# Patient Record
Sex: Male | Born: 2000 | Race: White | Hispanic: No | Marital: Single | Smoking: Current every day smoker
Health system: Southern US, Community
[De-identification: ages and names within clinical notes are randomized; demographics above are authoritative.]

## PROBLEM LIST (undated history)

## (undated) DIAGNOSIS — F909 Attention-deficit hyperactivity disorder, unspecified type: Secondary | ICD-10-CM

## (undated) DIAGNOSIS — I4891 Unspecified atrial fibrillation: Secondary | ICD-10-CM

---
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
## 2021-03-28 ENCOUNTER — Emergency Department (HOSPITAL_COMMUNITY)
Admission: EM | Admit: 2021-03-28 | Discharge: 2021-03-28 | Disposition: A | Discharge status: Home / Self Care | Payer: Medicaid Other | Source: Home / Self Care

## 2021-03-29 ENCOUNTER — Telehealth (HOSPITAL_COMMUNITY): Payer: Self-pay | Admitting: *Deleted

## 2021-03-29 NOTE — Telephone Encounter (Signed)
Opened in Error.

## 2021-04-05 ENCOUNTER — Encounter (HOSPITAL_COMMUNITY): Payer: Self-pay

## 2021-04-05 ENCOUNTER — Other Ambulatory Visit: Payer: Self-pay

## 2021-04-05 ENCOUNTER — Ambulatory Visit (INDEPENDENT_AMBULATORY_CARE_PROVIDER_SITE_OTHER): Payer: Medicaid Other | Admitting: Clinical

## 2021-04-05 DIAGNOSIS — F411 Generalized anxiety disorder: Secondary | ICD-10-CM
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
## 2021-04-05 NOTE — Plan of Care (Signed)
Verbal Consent 

## 2021-04-05 NOTE — Plan of Care (Signed)
Verbal consent  

## 2021-04-27 ENCOUNTER — Ambulatory Visit (INDEPENDENT_AMBULATORY_CARE_PROVIDER_SITE_OTHER): Payer: Medicaid Other | Admitting: Clinical

## 2021-04-27 ENCOUNTER — Other Ambulatory Visit: Payer: Self-pay

## 2021-04-27 DIAGNOSIS — F411 Generalized anxiety disorder: Secondary | ICD-10-CM

## 2021-04-27 NOTE — Progress Notes (Signed)
Virtual Visit via Telephone Note  I connected with Kenneth Walls on 04/27/21 at  4:00 PM EST by telephone and verified that I am speaking with the correct person using two identifiers.  Location: Patient: Home Provider: Office   I discussed the limitations, risks, security and privacy concerns of performing an evaluation and management service by telephone and the availability of in person appointments. I also discussed with the patient that there may be a patient responsible charge related to this service. The patient expressed understanding and agreed to proceed.   THERAPIST PROGRESS NOTE   Session Time: 4:00 PM- 4:30 PM   Participation Level: Active   Behavioral Response: CasualAlertAnxious   Type of Therapy: Individual Therapy   Treatment Goals addressed: Coping   Interventions: CBT, Motivational Interviewing, Strength-based and Supportive   Summary: Kenneth Walls is a 21 y.o. male who presents with GAD.  The OPT therapist worked with the patient for his ongoing OPT treatment. The OPT therapist utilized Motivational Interviewing to assist in creating therapeutic repore. The patient in the session was engaged and work in collaboration giving feedback about his triggers and symptoms over the past few weeks. The patient spoke about his work to manage his stressors. The patient spoke about his difficulty with over-thinking about things and reluctance to socialize due to wanting to avoid conflict. The patient spoke about his interaction being spending time with his brother who is 54yr younger than the patients.The OPT therapist utilized Cognitive Behavioral Therapy through cognitive restructuring as well as worked with the patient on coping strategies to assist in management of his diagnoses. The OPT therapist placed emphasis on the patient taking care of his basic needs. The OPT therapist reviewed with the patient his upcoming health appointments.     Suicidal/Homicidal:  Nowithout intent/plan   Therapist Response: The OPT therapist worked with the patient for the patients scheduled session. The patient was engaged in his session and gave feedback in relation to triggers, symptoms, and behavior responses over the past few weeks. The OPT therapist worked with the patient utilizing an in session Cognitive Behavioral Therapy exercise. 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 The OPT therapist placed emphasis with the patient on using coping skills and continuing to be compliant with all health recommendations from his specialist.The OPT .The patient overviewed his general health and attention to his basic needs in the areas of sleep, eating habits, physical exercise, and hygiene.The patient spoke about his desire to get his license have more freedom and move out to his own place.. The patient has been watching motivational videos from Youtube on how to make it through your day and how to meet your goals in life and noted these videos have been helpful. The OPT therapist will continue treatment work with the patient in his next scheduled session.    Plan: Return again in 3 weeks.   Diagnosis:      Axis I:  Generalized Anxiety Disorder                           Axis II: No diagnosis   I discussed the assessment and treatment plan with the patient. The patient was provided an opportunity to ask questions and  all were answered. The patient agreed with the plan and demonstrated an understanding of the instructions.   The patient was advised to call back or seek an in-person evaluation if the symptoms worsen or if the condition fails to improve as anticipated.   I provided 30 minutes of non-face-to-face time during this encounter.   Winfred Burn, LCSW    04/27/2021

## 2021-05-23 ENCOUNTER — Ambulatory Visit (INDEPENDENT_AMBULATORY_CARE_PROVIDER_SITE_OTHER): Payer: Medicaid Other | Admitting: Clinical

## 2021-05-23 ENCOUNTER — Other Ambulatory Visit: Payer: Self-pay

## 2021-05-23 DIAGNOSIS — F411 Generalized anxiety disorder: Secondary | ICD-10-CM | POA: Diagnosis not present

## 2021-05-23 NOTE — Progress Notes (Signed)
Virtual Visit via Telephone Note   I connected with Kenneth Walls on 05/23/21 at  3:00 PM EST by telephone and verified that I am speaking with the correct person using two identifiers.   Location: Patient: Home Provider: Office   I discussed the limitations, risks, security and privacy concerns of performing an evaluation and management service by telephone and the availability of in person appointments. I also discussed with the patient that there may be a patient responsible charge related to this service. The patient expressed understanding and agreed to proceed.     THERAPIST PROGRESS NOTE   Session Time: 3:00 PM- 3:30 PM   Participation Level: Active   Behavioral Response: CasualAlertAnxious   Type of Therapy: Individual Therapy   Treatment Goals addressed: Coping   Interventions: CBT, Motivational Interviewing, Strength-based and Supportive   Summary: Chukwuemeka Artola is a 21 y.o. male who presents with GAD.  The OPT therapist worked with the patient for his ongoing OPT treatment. The OPT therapist utilized Motivational Interviewing to assist in creating therapeutic repore. The patient in the session was engaged and work in collaboration giving feedback about his triggers and symptoms over the past few weeks. The patient spoke about his work to manage his stressors. The patient spoke about recently buying a four wheeler and this helping him to be able to get out of the house some and he has been riding a four wheeler with friends that go riding.The OPT therapist utilized Cognitive Behavioral Therapy through cognitive restructuring as well as worked with the patient on coping strategies to assist in management of his diagnoses. The OPT therapist placed emphasis on the patient taking care of his basic needs. The OPT therapist reviewed with the patient his upcoming health appointments.     Suicidal/Homicidal: Nowithout intent/plan   Therapist Response: The OPT therapist  worked with the patient for the patients scheduled session. The patient was engaged in his session and gave feedback in relation to triggers, symptoms, and behavior responses over the past few weeks. The OPT therapist worked with the patient utilizing an in session Cognitive Behavioral Therapy exercise. The patient was responsive in the session and verbalized, " I have doing better since I bought the four wheeler I have been able to get out and meet with some new people".The OPT .The patient overviewed his general health and attention to his basic needs in the areas of sleep, eating habits, physical exercise, and hygiene.The patient spoke about his ongoing hope to get his license have more freedom and move out to his own place. The OPT therapist will continue treatment work with the patient in his next scheduled session.    Plan: Return again in 3 weeks.   Diagnosis:      Axis I:  Generalized Anxiety Disorder                           Axis II: No diagnosis   I discussed the assessment and treatment plan with the patient. The patient was provided an opportunity to ask questions and all were answered. The patient agreed with the plan and demonstrated an understanding of the instructions.   The patient was advised to call back or seek an in-person evaluation if the symptoms worsen or if the condition fails to improve as anticipated.   I provided 30 minutes of non-face-to-face time during this encounter.   Winfred Burn, LCSW    05/23/2021

## 2021-06-14 ENCOUNTER — Ambulatory Visit (INDEPENDENT_AMBULATORY_CARE_PROVIDER_SITE_OTHER): Payer: Medicaid Other | Admitting: Clinical

## 2021-06-14 ENCOUNTER — Other Ambulatory Visit: Payer: Self-pay

## 2021-06-14 DIAGNOSIS — F411 Generalized anxiety disorder: Secondary | ICD-10-CM | POA: Diagnosis not present

## 2021-06-14 NOTE — Progress Notes (Signed)
Virtual Visit via Telephone Note ?  ?I connected with Kenneth Walls on 06/14/21 at  2:00 PM EST by telephone and verified that I am speaking with the correct person using two identifiers. ?  ?Location: ?Patient: Home ?Provider: Office ?  ?I discussed the limitations, risks, security and privacy concerns of performing an evaluation and management service by telephone and the availability of in person appointments. I also discussed with the patient that there may be a patient responsible charge related to this service. The patient expressed understanding and agreed to proceed. ?  ?  ?THERAPIST PROGRESS NOTE ?  ?Session Time: 2:00 PM- 2:30 PM ?  ?Participation Level: Active ?  ?Behavioral Response: CasualAlertAnxious ?  ?Type of Therapy: Individual Therapy ?  ?Treatment Goals addressed: Coping ?  ?Interventions: CBT, Motivational Interviewing, Strength-based and Supportive ?  ?Summary: Kenneth Walls is a 21 y.o. male who presents with GAD.  The OPT therapist worked with the patient for his ongoing OPT treatment. The OPT therapist utilized Motivational Interviewing to assist in creating therapeutic repore. The patient in the session was engaged and work in collaboration giving feedback about his triggers and symptoms over the past few weeks. The patient spoke about his work to manage his stressors. The patient spoke about utilizing his four wheeler as leisure and this helping him to be able to get out of the house more and go with friends riding.The patient has an appointment coming up tomorrow with his eye specialist where he will learn more about his chances of being able to apply for a conditional drivers license. The OPT therapist utilized Cognitive Behavioral Therapy through cognitive restructuring as well as worked with the patient on coping strategies to assist in management of his diagnoses. The OPT therapist placed emphasis on the patient taking care of his basic needs. The patient spoke about his  upcoming plans to go with his girlfriend , sister, and her husband to [place] , [place]. The OPT therapist reviewed with the patient his upcoming health appointments. ?  ?  ?Suicidal/Homicidal: Nowithout intent/plan ?  ?Therapist Response: The OPT therapist worked with the patient for the patients scheduled session. The patient was engaged in his session and gave feedback in relation to triggers, symptoms, and behavior responses over the past few weeks. The OPT therapist worked with the patient utilizing an in session Cognitive Behavioral Therapy exercise. The patient was responsive in the session and verbalized, " Things over all are going good I am a little nervous about my eye appointment tomorrow and going on this trip but I think it is a chance to try the next relationship level ".The OPT patient overviewed his general health and attention to his basic needs in the areas of sleep, eating habits, physical exercise, and hygiene.The patient spoke about his ongoing hope to get his license have more freedom. The OPT therapist will continue treatment work with the patient in his next scheduled session.  ?  ?Plan: Return again in 3 weeks. ?  ?Diagnosis:      Axis I:  Generalized Anxiety Disorder ?  ?                        Axis II: No diagnosis ? ?Collaboration of Care: No additional collaboration of care for this session. ?  ?Patient/Guardian was advised Release of Information must be obtained prior to any record release in order to collaborate their care with an outside provider. Patient/Guardian was advised if they have not  already done so to contact the registration department to sign all necessary forms in order for [place] to release information regarding their care.  ?  ?Consent: Patient/Guardian gives verbal consent for treatment and assignment of benefits for services provided during this visit. Patient/Guardian expressed understanding and agreed to proceed ? ?I discussed the assessment and treatment plan with the  patient. The patient was provided an opportunity to ask questions and all were answered. The patient agreed with the plan and demonstrated an understanding of the instructions. ?  ?The patient was advised to call back or seek an in-person evaluation if the symptoms worsen or if the condition fails to improve as anticipated. ?  ?I provided 30 minutes of non-face-to-face time during this encounter. ?  ?Winfred Burn, LCSW  ?  ?06/14/2021 ?

## 2021-07-12 ENCOUNTER — Other Ambulatory Visit: Payer: Self-pay

## 2021-07-12 ENCOUNTER — Emergency Department (HOSPITAL_COMMUNITY): Payer: Medicaid Other

## 2021-07-12 ENCOUNTER — Encounter (HOSPITAL_COMMUNITY): Payer: Self-pay

## 2021-07-12 ENCOUNTER — Emergency Department (HOSPITAL_COMMUNITY)
Admission: EM | Admit: 2021-07-12 | Discharge: 2021-07-13 | Disposition: A | Discharge status: Home / Self Care | Payer: Medicaid Other | Attending: Emergency Medicine | Admitting: Emergency Medicine

## 2021-07-12 DIAGNOSIS — S3991XA Unspecified injury of abdomen, initial encounter: Secondary | ICD-10-CM | POA: Diagnosis present

## 2021-07-12 DIAGNOSIS — Y92410 Unspecified street and highway as the place of occurrence of the external cause: Secondary | ICD-10-CM | POA: Diagnosis not present

## 2021-07-12 DIAGNOSIS — S301XXA Contusion of abdominal wall, initial encounter: Secondary | ICD-10-CM | POA: Diagnosis not present

## 2021-07-12 MED ORDER — IOHEXOL 300 MG/ML  SOLN
100.0000 mL | Freq: Once | INTRAMUSCULAR | Status: AC | PRN
  Administered 2021-07-12: 100 mL via INTRAVENOUS

## 2021-07-12 NOTE — Discharge Instructions (Addendum)
Return if any problems.
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
## 2021-07-12 NOTE — ED Notes (Signed)
Patient transported to CT 

## 2021-07-12 NOTE — ED Notes (Signed)
Patient back from CT.

## 2021-07-12 NOTE — ED Notes (Signed)
Pt ambulated to and from bathroom. When pt sat down in bed he said he was in pain while ambulating, but sitting still he isn't. Nurse notified ?

## 2022-07-04 ENCOUNTER — Encounter (HOSPITAL_COMMUNITY): Payer: Self-pay | Admitting: *Deleted

## 2022-07-04 ENCOUNTER — Other Ambulatory Visit: Payer: Self-pay

## 2022-07-04 ENCOUNTER — Emergency Department (HOSPITAL_COMMUNITY): Payer: Self-pay

## 2022-07-04 ENCOUNTER — Emergency Department (HOSPITAL_COMMUNITY)
Admission: EM | Admit: 2022-07-04 | Discharge: 2022-07-04 | Disposition: A | Discharge status: Home / Self Care | Payer: Self-pay | Attending: Emergency Medicine | Admitting: Emergency Medicine

## 2022-07-04 DIAGNOSIS — Z1152 Encounter for screening for COVID-19: Secondary | ICD-10-CM | POA: Insufficient documentation

## 2022-07-04 DIAGNOSIS — J101 Influenza due to other identified influenza virus with other respiratory manifestations: Secondary | ICD-10-CM | POA: Insufficient documentation

## 2022-07-04 LAB — RESP PANEL BY RT-PCR (RSV, FLU A&B, COVID)  RVPGX2
Influenza A by PCR: NEGATIVE
Influenza B by PCR: POSITIVE — AB
Resp Syncytial Virus by PCR: NEGATIVE
SARS Coronavirus 2 by RT PCR: NEGATIVE

## 2022-07-04 MED ORDER — IBUPROFEN 800 MG PO TABS
800.0000 mg | ORAL_TABLET | Freq: Once | ORAL | Status: AC
  Administered 2022-07-04: 800 mg via ORAL
  Filled 2022-07-04: qty 1

## 2022-07-04 NOTE — ED Provider Notes (Signed)
[facility] Provider Note   CSN: [id number] Arrival date & time: 07/04/22  1753     History  Chief Complaint  Patient presents with   Headache    Lamarco Abney is a 22 y.o. male, no pertinent past medical history, who presents to the ED 2/2 to runny nose, cough, body aches, chills, and low-grade fever for the last 4 days.  States that on Saturday he started feeling unwell, and notes that it is gotten better and then worse, and is alternate between improving and worsening.  Endorses also having some nausea, vomiting and diarrhea which she states has resolved.  Has been able to eat and drink since then.  States that his boss at work it was recently sick and out last week.     Home Medications Prior to Admission medications   Medication Sig Start Date End Date Taking? Authorizing Provider  amoxicillin (AMOXIL) 500 MG capsule Take 1 capsule (500 mg total) by mouth 3 (three) times daily. 12/04/16   Triplett, Tammy, PA-C  calcium carbonate (TUMS) 500 MG chewable tablet Chew 1 tablet (200 mg of elemental calcium total) by mouth as needed for indigestion or heartburn. 10/18/16   Horton, Barbette Hair, MD  magic mouthwash w/lidocaine SOLN Take 5 mLs by mouth 3 (three) times daily as needed for mouth pain. Swish and spit, do not swallow 12/04/16   Triplett, Tammy, PA-C  methylphenidate (CONCERTA) 54 MG PO CR tablet Take 1 tablet (54 mg total) by mouth every morning. 03/08/16   Evalee Jefferson, PA-C      Allergies    Patient has no known allergies.    Review of Systems   Review of Systems  HENT:  Negative for sore throat.   Gastrointestinal:  Positive for diarrhea, nausea and vomiting.  Neurological:  Positive for headaches.    Physical Exam Updated Vital Signs BP 102/68 (BP Location: Right Arm)   Pulse (!) 104   Temp 98.9 F (37.2 C) (Oral)   Resp 15   Ht 5\' 11"  (1.803 m)   Wt 59 kg   SpO2 100%   BMI 18.13 kg/m  Physical Exam Vitals and  nursing note reviewed.  Constitutional:      General: He is not in acute distress.    Appearance: He is well-developed.  HENT:     Head: Normocephalic and atraumatic.  Eyes:     Conjunctiva/sclera: Conjunctivae normal.  Cardiovascular:     Rate and Rhythm: Normal rate and regular rhythm.     Heart sounds: No murmur heard. Pulmonary:     Effort: Pulmonary effort is normal. No respiratory distress.     Breath sounds: Normal breath sounds.  Abdominal:     Palpations: Abdomen is soft.     Tenderness: There is no abdominal tenderness.  Musculoskeletal:        General: No swelling.     Cervical back: Neck supple.  Skin:    General: Skin is warm and dry.     Capillary Refill: Capillary refill takes less than 2 seconds.  Neurological:     Mental Status: He is alert.  Psychiatric:        Mood and Affect: Mood normal.     ED Results / Procedures / Treatments   Labs (all labs ordered are listed, but only abnormal results are displayed) Labs Reviewed  RESP PANEL BY RT-PCR (RSV, FLU A&B, COVID)  RVPGX2 - Abnormal; Notable for the following components:  Result Value   Influenza B by PCR POSITIVE (*)    All other components within normal limits    EKG None  Radiology DG Chest 2 View  Result Date: 07/04/2022 CLINICAL DATA:  Cough, shortness of breath. EXAM: CHEST - 2 VIEW COMPARISON:  October 18, 2016. FINDINGS: The heart size and mediastinal contours are within normal limits. Both lungs are clear. The visualized skeletal structures are unremarkable. IMPRESSION: No active cardiopulmonary disease. Electronically Signed   By: Marijo Conception M.D.   On: 07/04/2022 19:49    Procedures Procedures    Medications Ordered in ED Medications  ibuprofen (ADVIL) tablet 800 mg (800 mg Oral Given 07/04/22 1952)    ED Course/ Medical Decision Making/ A&P                             Medical Decision Making Patient is a 22 year old male, here for fatigue, headache, nausea, vomiting,  diarrhea, low-grade fever, and muscle aches for the last 4 days.  States someone at work was sick.  We will evaluate for COVID/flu and obtain a chest x-ray.  Amount and/or Complexity of Data Reviewed Labs:     Details: Influenza positive Radiology: ordered.    Details: Unremarkable Discussion of management or test interpretation with external provider(s): Discussed with patient he is influenza positive we discussed supportive care and return precautions and he voiced understanding.  He has no neck rigidity, is overall well-appearing, thus my concern for meningitis is very low.  I believe that he is just sick with a viral illness and is positive for influenza.  Risk Prescription drug management.    Final Clinical Impression(s) / ED Diagnoses Final diagnoses:  Influenza B    Rx / DC Orders ED Discharge Orders     None         Josephine Wooldridge, Si Gaul, PA 07/04/22 2248    Milton Ferguson, MD 07/06/22 1104

## 2022-07-04 NOTE — Discharge Instructions (Signed)
Please drink lots of fluids, and rest.  You have the flu she should stay away from other individuals who are in the getting sick.  Wear a mask to prevent the spread of the illness.  If you have intractable nausea, vomiting, or unable to eat or drink please return to the ER.

## 2022-07-04 NOTE — ED Triage Notes (Signed)
Pt states he mowed the yard on Friday, next day woke up with a HA, body aches, eye pain and chills. Denies any N/V, + diarrhea

## 2022-12-18 ENCOUNTER — Emergency Department (HOSPITAL_COMMUNITY)
Admission: EM | Admit: 2022-12-18 | Discharge: 2022-12-18 | Discharge status: Against Medical Advice | Payer: No Typology Code available for payment source | Source: Home / Self Care

## 2023-02-13 ENCOUNTER — Encounter (HOSPITAL_COMMUNITY): Payer: Self-pay | Admitting: *Deleted

## 2023-02-13 ENCOUNTER — Other Ambulatory Visit: Payer: Self-pay

## 2023-02-13 ENCOUNTER — Emergency Department (HOSPITAL_COMMUNITY)
Admission: EM | Admit: 2023-02-13 | Discharge: 2023-02-13 | Disposition: A | Discharge status: Home / Self Care | Payer: Worker's Compensation | Attending: Student | Admitting: Student

## 2023-02-13 ENCOUNTER — Emergency Department (HOSPITAL_COMMUNITY): Payer: 59

## 2023-02-13 DIAGNOSIS — M79604 Pain in right leg: Secondary | ICD-10-CM | POA: Diagnosis present

## 2023-02-13 DIAGNOSIS — F17210 Nicotine dependence, cigarettes, uncomplicated: Secondary | ICD-10-CM | POA: Insufficient documentation

## 2023-02-13 MED ORDER — NAPROXEN 250 MG PO TABS
500.0000 mg | ORAL_TABLET | Freq: Once | ORAL | Status: AC
  Administered 2023-02-13: 500 mg via ORAL
  Filled 2023-02-13: qty 2

## 2023-02-13 MED ORDER — KETOROLAC TROMETHAMINE 15 MG/ML IJ SOLN: 15.0000 mg | Freq: Once | INTRAMUSCULAR | Status: DC

## 2023-02-13 MED ORDER — NAPROXEN 375 MG PO TABS: 375.0000 mg | ORAL_TABLET | Freq: Two times a day (BID) | ORAL | 0 refills | Status: DC
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
## 2023-04-20 ENCOUNTER — Emergency Department (HOSPITAL_COMMUNITY): Payer: 59

## 2023-04-20 ENCOUNTER — Other Ambulatory Visit: Payer: Self-pay

## 2023-04-20 ENCOUNTER — Emergency Department (HOSPITAL_COMMUNITY)
Admission: EM | Admit: 2023-04-20 | Discharge: 2023-04-20 | Disposition: A | Discharge status: Home / Self Care | Payer: 59 | Attending: Emergency Medicine | Admitting: Emergency Medicine

## 2023-04-20 ENCOUNTER — Encounter (HOSPITAL_COMMUNITY): Payer: Self-pay | Admitting: *Deleted

## 2023-04-20 DIAGNOSIS — I4891 Unspecified atrial fibrillation: Secondary | ICD-10-CM | POA: Insufficient documentation

## 2023-04-20 DIAGNOSIS — Z79899 Other long term (current) drug therapy: Secondary | ICD-10-CM | POA: Diagnosis not present

## 2023-04-20 DIAGNOSIS — R002 Palpitations: Secondary | ICD-10-CM | POA: Diagnosis present

## 2023-04-20 LAB — COMPREHENSIVE METABOLIC PANEL
ALT: 18 U/L (ref 0–44)
AST: 22 U/L (ref 15–41)
Albumin: 4.3 g/dL (ref 3.5–5.0)
Alkaline Phosphatase: 56 U/L (ref 38–126)
Anion gap: 9 (ref 5–15)
BUN: 15 mg/dL (ref 6–20)
CO2: 25 mmol/L (ref 22–32)
Calcium: 9.3 mg/dL (ref 8.9–10.3)
Chloride: 101 mmol/L (ref 98–111)
Creatinine, Ser: 0.82 mg/dL (ref 0.61–1.24)
GFR, Estimated: >60 mL/min (ref >60)
Glucose, Bld: 92 mg/dL (ref 70–99)
Potassium: 3.7 mmol/L (ref 3.5–5.1)
Sodium: 135 mmol/L (ref 135–145)
Total Bilirubin: 0.7 mg/dL (ref 0.0–1.2)
Total Protein: 6.9 g/dL (ref 6.5–8.1)

## 2023-04-20 LAB — RAPID URINE DRUG SCREEN, HOSP PERFORMED
Amphetamines: NOT DETECTED
Barbiturates: NOT DETECTED
Benzodiazepines: NOT DETECTED
Cocaine: NOT DETECTED
Opiates: NOT DETECTED
Tetrahydrocannabinol: NOT DETECTED

## 2023-04-20 LAB — CBC WITH DIFFERENTIAL/PLATELET
Abs Immature Granulocytes: 0.01 10*3/uL (ref 0.00–0.07)
Basophils Absolute: 0.1 10*3/uL (ref 0.0–0.1)
Basophils Relative: 1 %
Eosinophils Absolute: 0.3 10*3/uL (ref 0.0–0.5)
Eosinophils Relative: 6 %
HCT: 43.8 % (ref 39.0–52.0)
Hemoglobin: 14.5 g/dL (ref 13.0–17.0)
Immature Granulocytes: 0 %
Lymphocytes Relative: 28 %
Lymphs Abs: 1.6 10*3/uL (ref 0.7–4.0)
MCH: 26.9 pg (ref 26.0–34.0)
MCHC: 33.1 g/dL (ref 30.0–36.0)
MCV: 81.3 fL (ref 80.0–100.0)
Monocytes Absolute: 0.6 10*3/uL (ref 0.1–1.0)
Monocytes Relative: 11 %
Neutro Abs: 3.2 10*3/uL (ref 1.7–7.7)
Neutrophils Relative %: 54 %
Platelets: 295 10*3/uL (ref 150–400)
RBC: 5.39 MIL/uL (ref 4.22–5.81)
RDW: 12.3 % (ref 11.5–15.5)
WBC: 5.9 10*3/uL (ref 4.0–10.5)
nRBC: 0 % (ref 0.0–0.2)

## 2023-04-20 LAB — TSH: TSH: 0.851 u[IU]/mL (ref 0.350–4.500)

## 2023-04-20 LAB — ETHANOL: Alcohol, Ethyl (B): <10 mg/dL (ref <10)

## 2023-04-20 LAB — MAGNESIUM: Magnesium: 2 mg/dL (ref 1.7–2.4)

## 2023-04-20 MED ORDER — METOPROLOL TARTRATE 25 MG PO TABS
12.5000 mg | ORAL_TABLET | Freq: Once | ORAL | Status: AC
  Administered 2023-04-20: 12.5 mg via ORAL
  Filled 2023-04-20: qty 1

## 2023-04-20 MED ORDER — METOPROLOL TARTRATE 25 MG PO TABS: 12.5000 mg | ORAL_TABLET | Freq: Two times a day (BID) | ORAL | 1 refills | Status: DC

## 2023-04-20 NOTE — Discharge Instructions (Signed)
 He will follow-up with the atrial fibrillation clinic at [facility].  They should contact you next week with an appointment.  Return if any problems

## 2023-04-20 NOTE — ED Provider Notes (Signed)
  EMERGENCY DEPARTMENT AT [facility] Provider Note   CSN: [id number] Arrival date & time: 04/20/23  9285     History  Chief Complaint  Patient presents with   Palpitations    Kenneth Walls is a 23 y.o. male.  Patient complains of palpitations.  He had 5 beers last night.  Patient does not usually drink alcohol more than once or twice a week.  The history is provided by the patient and medical records. No language interpreter was used.  Palpitations Palpitations quality:  Irregular Onset quality:  Sudden Timing:  Constant Progression:  Waxing and waning Chronicity:  New Context: not anxiety   Relieved by:  Nothing Associated symptoms: no back pain, no chest pain and no cough        Home Medications Prior to Admission medications   Medication Sig Start Date End Date Taking? Authorizing Provider  metoprolol  tartrate (LOPRESSOR ) 25 MG tablet Take 0.5 tablets (12.5 mg total) by mouth 2 (two) times daily. 04/20/23  Yes Suzette Pac, MD  amoxicillin  (AMOXIL ) 500 MG capsule Take 1 capsule (500 mg total) by mouth 3 (three) times daily. 12/04/16   Triplett, Tammy, PA-C  calcium  carbonate (TUMS) 500 MG chewable tablet Chew 1 tablet (200 mg of elemental calcium  total) by mouth as needed for indigestion or heartburn. 10/18/16   Horton, Charmaine FALCON, MD  cyclobenzaprine (FLEXERIL) 5 MG tablet Take 5 mg by mouth at bedtime as needed. 12/18/22   [provider]  diclofenac (VOLTAREN) 75 MG EC tablet Take 75 mg by mouth 2 (two) times daily. 12/18/22   [provider]  magic mouthwash w/lidocaine  SOLN Take 5 mLs by mouth 3 (three) times daily as needed for mouth pain. Swish and spit, do not swallow 12/04/16   Triplett, Tammy, PA-C  methylphenidate  (CONCERTA ) 54 MG PO CR tablet Take 1 tablet (54 mg total) by mouth every morning. 03/08/16   Idol, Julie, PA-C  naproxen  (NAPROSYN ) 375 MG tablet Take 1 tablet (375 mg total) by mouth 2 (two) times daily. 02/13/23    Kommor, Lum, MD      Allergies    Patient has no known allergies.    Review of Systems   Review of Systems  Constitutional:  Negative for appetite change and fatigue.  HENT:  Negative for congestion, ear discharge and sinus pressure.   Eyes:  Negative for discharge.  Respiratory:  Negative for cough.   Cardiovascular:  Positive for palpitations. Negative for chest pain.  Gastrointestinal:  Negative for abdominal pain and diarrhea.  Genitourinary:  Negative for frequency and hematuria.  Musculoskeletal:  Negative for back pain.  Skin:  Negative for rash.  Neurological:  Negative for seizures and headaches.  Psychiatric/Behavioral:  Negative for hallucinations.     Physical Exam Updated Vital Signs BP 117/78   Pulse (!) 110   Temp 98.2 F (36.8 C) (Oral)   Resp 20   Ht 5' 11 (1.803 m)   Wt 59 kg   SpO2 99%   BMI 18.13 kg/m  Physical Exam Vitals and nursing note reviewed.  Constitutional:      Appearance: He is well-developed.  HENT:     Head: Normocephalic.     Nose: Nose normal.  Eyes:     General: No scleral icterus.    Conjunctiva/sclera: Conjunctivae normal.  Neck:     Thyroid : No thyromegaly.  Cardiovascular:     Rate and Rhythm: Normal rate. Rhythm irregular.     Heart sounds: No murmur heard.  No friction rub. No gallop.  Pulmonary:     Breath sounds: No stridor. No wheezing or rales.  Chest:     Chest wall: No tenderness.  Abdominal:     General: There is no distension.     Tenderness: There is no abdominal tenderness. There is no rebound.  Musculoskeletal:        General: Normal range of motion.     Cervical back: Neck supple.  Lymphadenopathy:     Cervical: No cervical adenopathy.  Skin:    Findings: No erythema or rash.  Neurological:     Mental Status: He is alert and oriented to person, place, and time.     Motor: No abnormal muscle tone.     Coordination: Coordination normal.  Psychiatric:        Behavior: Behavior normal.      ED Results / Procedures / Treatments   Labs (all labs ordered are listed, but only abnormal results are displayed) Labs Reviewed  CBC WITH DIFFERENTIAL/PLATELET  COMPREHENSIVE METABOLIC PANEL  RAPID URINE DRUG SCREEN, HOSP PERFORMED  ETHANOL  MAGNESIUM  TSH    EKG None  Radiology DG Chest Port 1 View Result Date: 04/20/2023 CLINICAL DATA:  Shortness of breath. EXAM: PORTABLE CHEST 1 VIEW COMPARISON:  None Available. FINDINGS: The heart size and mediastinal contours are within normal limits. Both lungs are clear. The visualized skeletal structures are unremarkable. IMPRESSION: No active disease. Electronically Signed   By: Camellia Candle M.D.   On: 04/20/2023 08:00    Procedures Procedures    Medications Ordered in ED Medications  metoprolol  tartrate (LOPRESSOR ) tablet 12.5 mg (has no administration in time range)    ED Course/ Medical Decision Making/ A&P                                 Medical Decision Making Amount and/or Complexity of Data Reviewed Labs: ordered. Radiology: ordered. ECG/medicine tests: ordered.  Risk Prescription drug management.  This patient presents to the ED for concern of palpitations, this involves an extensive number of treatment options, and is a complaint that carries with it a high risk of complications and morbidity.  The differential diagnosis includes anxiety, irregular rhythm   Co morbidities that complicate the patient evaluation  None   Additional history obtained:  Additional history obtained from patient External records from outside source obtained and reviewed including hospital records   Lab Tests:  I Ordered, and personally interpreted labs.  The pertinent results include: CBC and chemistries unremarkable   Imaging Studies ordered:  I ordered imaging studies including chest x-ray I independently visualized and interpreted imaging which showed negative I agree with the radiologist  interpretation   Cardiac Monitoring: / EKG:  The patient was maintained on a cardiac monitor.  I personally viewed and interpreted the cardiac monitored which showed an underlying rhythm of: Atrial fibrillation   Consultations Obtained:  I requested consultation with the cardiology,  and discussed lab and imaging findings as well as pertinent plan - they recommend: Discharge patient home on Lopressor    Problem List / ED Course / Critical interventions / Medication management  Atrial FaBB I ordered medication including beta-blocker Reevaluation of the patient after these medicines showed that the patient improved I have reviewed the patients home medicines and have made adjustments as needed   Social Determinants of Health:  None   Test / Admission - Considered:  None  Atrial fibrillation  with RVR.  Patient placed on Lopressor         Final Clinical Impression(s) / ED Diagnoses Final diagnoses:  Atrial fibrillation with rapid ventricular response (HCC)    Rx / DC Orders ED Discharge Orders          Ordered    metoprolol  tartrate (LOPRESSOR ) 25 MG tablet  2 times daily        04/20/23 1232              Suzette Pac, MD 04/23/23 1512

## 2023-04-20 NOTE — Progress Notes (Signed)
 Patient discussed with ER staff. 22 yo male history of ADHD presents with palpitations. Does report multiple beers last night, however denies regular heavy EtoH consumption. EKG shows afib rate 104, new diagnosis for patient. His CHADS2Vasc score is 0 and no anticoagulation is indicated, he has lone afib with mildly elevated rates, perhaps related to [facility] use  Start lopressor  12.5mg  bid. Add Mg and TSH to labs. With just mild tachycardia and symptoms ok for outpatient workup. We will arrange appt in afib clinic.    Dorn Ross MD

## 2023-04-20 NOTE — ED Triage Notes (Signed)
 Pt c/o feeling like his heart is beating fast; pt states he had the feeling last night before going to bed but thought it might have been from drinking beers last night   Pt c/o feeling sob and no pain

## 2023-04-30 ENCOUNTER — Encounter (HOSPITAL_COMMUNITY): Payer: Self-pay | Admitting: Internal Medicine

## 2023-04-30 ENCOUNTER — Ambulatory Visit (HOSPITAL_COMMUNITY)
Admission: RE | Admit: 2023-04-30 | Discharge: 2023-04-30 | Disposition: A | Discharge status: Home / Self Care | Payer: 59 | Source: Ambulatory Visit | Attending: Internal Medicine | Admitting: Internal Medicine

## 2023-04-30 VITALS — BP 116/62 | HR 78 | Ht 71.0 in | Wt 119.8 lb

## 2023-04-30 DIAGNOSIS — I480 Paroxysmal atrial fibrillation: Secondary | ICD-10-CM | POA: Diagnosis present

## 2023-04-30 NOTE — Patient Instructions (Signed)
Complete current metoprolol prescription then can stop.

## 2023-04-30 NOTE — Progress Notes (Addendum)
Primary Care Physician: Assunta Found, MD Primary Cardiologist: None Electrophysiologist: None     Referring Physician: ED     Kenneth Walls is a 23 y.o. male with a history of ADHD who presents for consultation in the [facility]. The patient was initially diagnosed with atrial fibrillation on 04/20/23 after presenting to ED with symptoms of palpitations found to be in new onset Afib with RVR (HR 104). Discharged on Lopressor 12.5 mg BID. Patient has a CHADS2VASC score of zero.  On evaluation today, he is currently in NSR. He notes the day prior to ED visit had acute stress with argument with fiance and drank 5 beers. He normally drinks occasional alcohol weekly. He does admit to drinking 6 Dr. Reino Kent beverages every day. Denies snoring or stop breathing. He is about to start Lexapro for anxiety.   Today, he denies symptoms of chest pain, shortness of breath, orthopnea, PND, lower extremity edema, dizziness, presyncope, syncope, snoring, daytime somnolence, bleeding, or neurologic sequela. The patient is tolerating medications without difficulties and is otherwise without complaint today.    Atrial Fibrillation Risk Factors:  he does not have symptoms or diagnosis of sleep apnea.   he has a BMI of Body mass index is 16.71 kg/m.Marland Kitchen Filed Weights   04/30/23 0853  Weight: 54.3 kg    Current Outpatient Medications  Medication Sig Dispense Refill   escitalopram (LEXAPRO) 10 MG tablet Take 10 mg by mouth daily.     metoprolol tartrate (LOPRESSOR) 25 MG tablet Take 0.5 tablets (12.5 mg total) by mouth 2 (two) times daily. 60 tablet 1   calcium carbonate (TUMS) 500 MG chewable tablet Chew 1 tablet (200 mg of elemental calcium total) by mouth as needed for indigestion or heartburn. (Patient not taking: Reported on 04/30/2023) 30 tablet 0   No current facility-administered medications for this encounter.    Atrial Fibrillation Management  history:  Previous antiarrhythmic drugs: none Previous cardioversions: none Previous ablations: none Anticoagulation history: none   ROS- All systems are reviewed and negative except as per the HPI above.  Physical Exam: BP 116/62   Pulse 78   Ht 5\' 11"  (1.803 m)   Wt 54.3 kg   BMI 16.71 kg/m   GEN: Well nourished, well developed in no acute distress NECK: No JVD; No carotid bruits CARDIAC: Regular rate and rhythm, no murmurs, rubs, gallops RESPIRATORY:  Clear to auscultation without rales, wheezing or rhonchi  ABDOMEN: Soft, non-tender, non-distended EXTREMITIES:  No edema; No deformity   EKG today demonstrates  Vent. rate 78 BPM PR interval 146 ms QRS duration 92 ms QT/QTcB 382/435 ms P-R-T axes 87 88 69 Normal sinus rhythm with sinus arrhythmia Normal ECG When compared with ECG of 20-Apr-2023 07:26, PREVIOUS ECG IS PRESENT  Echo N/A  ASSESSMENT & PLAN CHA2DS2-VASc Score = 0  The patient's score is based upon: CHF History: 0 HTN History: 0 Diabetes History: 0 Stroke History: 0 Vascular Disease History: 0 Age Score: 0 Gender Score: 0       ASSESSMENT AND PLAN: Paroxysmal Atrial Fibrillation (ICD10:  I48.0) The patient's CHA2DS2-VASc score is 0, indicating a 0.2% annual risk of stroke.    He is currently in NSR. Education provided about Afib. We mainly focused on triggers and the emphasis on patient reducing his caffeine intake. We discussed guideline recommendations on alcohol intake and caffeine intake. Discussion about medication - he can stop the metoprolol 12.5 mg BID after 30 days. We also discussed cessation of  tobacco. After discussion, we will proceed with conservative observation at this time. Will order baseline echocardiogram and call with results.     Follow up Afib clinic prn.    [name], PA-C  Afib Clinic [facility] [address] [place], [place] [address] [phone]

## 2023-05-04 ENCOUNTER — Other Ambulatory Visit (HOSPITAL_COMMUNITY): Payer: Self-pay

## 2023-05-25 ENCOUNTER — Encounter (HOSPITAL_COMMUNITY): Payer: Self-pay

## 2023-08-14 ENCOUNTER — Encounter: Payer: Self-pay | Admitting: Emergency Medicine

## 2023-08-14 ENCOUNTER — Ambulatory Visit
Admission: EM | Admit: 2023-08-14 | Discharge: 2023-08-14 | Disposition: A | Discharge status: Home / Self Care | Payer: Self-pay | Attending: Nurse Practitioner | Admitting: Nurse Practitioner

## 2023-08-14 DIAGNOSIS — L282 Other prurigo: Secondary | ICD-10-CM

## 2023-08-14 DIAGNOSIS — W57XXXA Bitten or stung by nonvenomous insect and other nonvenomous arthropods, initial encounter: Secondary | ICD-10-CM

## 2023-08-14 HISTORY — DX: Unspecified atrial fibrillation: I48.91

## 2023-08-14 NOTE — Discharge Instructions (Signed)
 The tick was removed in its entirety today. Continue monitor the area where the bite occurred.  Follow-up immediately if you experience a bull's-eye appearing rash, fever, chills, severe fatigue, joint pain, joint swelling, dizziness, or other concerns. Follow-up as needed.

## 2023-08-14 NOTE — ED Provider Notes (Signed)
 [facility]    CSN: [id number] Arrival date & time: 08/14/23  1749      History   Chief Complaint No chief complaint on file.   HPI Kenneth Walls is a 23 y.o. male.   The history is provided by the patient.   Patient presents for tick bite to the right inner thigh that has been present for the past 3 to 4 days.  Patient states that he did remove most of the tick, but the head of the tick remains in place.  Patient denies fever, chills, fatigue, joint pain, joint swelling, nausea, vomiting, diarrhea, or rash.  Past Medical History:  Diagnosis Date   ADHD (attention deficit hyperactivity disorder)    Atrial fibrillation [facility])     Patient Active Problem List   Diagnosis Date Noted   Paroxysmal atrial fibrillation (HCC) 04/30/2023    History reviewed. No pertinent surgical history.     Home Medications    Prior to Admission medications   Not on File    Family History Family History  Problem Relation Age of Onset   Heart disease Brother    Heart disease Maternal Grandfather     Social History Social History   Tobacco Use   Smoking status: Every Day    Current packs/day: 1.00    Types: Cigarettes   Smokeless tobacco: Former    Types: Snuff  Vaping Use   Vaping status: Some Days   Last attempt to quit: 05/07/2018   Substances: Nicotine, Flavoring  Substance Use Topics   Alcohol use: Yes    Alcohol/week: 4.0 standard drinks of alcohol    Types: 2 Cans of beer, 2 Shots of liquor per week   Drug use: Not Currently    Types: Marijuana    Comment: denies use x 4 mos.     Allergies   Patient has no known allergies.   Review of Systems Review of Systems Per HPI  Physical Exam Triage Vital Signs ED Triage Vitals  Encounter Vitals Group     BP 08/14/23 1827 113/67     Systolic BP Percentile --      Diastolic BP Percentile --      Pulse Rate 08/14/23 1827 75     Resp 08/14/23 1827 18     Temp 08/14/23 1827 98.3 F (36.8 C)      Temp Source 08/14/23 1827 Oral     SpO2 08/14/23 1827 97 %     Weight --      Height --      Head Circumference --      Peak Flow --      Pain Score 08/14/23 1828 0     Pain Loc --      Pain Education --      Exclude from Growth Chart --    No data found.  Updated Vital Signs BP 113/67 (BP Location: Right Arm)   Pulse 75   Temp 98.3 F (36.8 C) (Oral)   Resp 18   SpO2 97%   Visual Acuity Right Eye Distance:   Left Eye Distance:   Bilateral Distance:    Right Eye Near:   Left Eye Near:    Bilateral Near:     Physical Exam   UC Treatments / Results  Labs (all labs ordered are listed, but only abnormal results are displayed) Labs Reviewed - No data to display  EKG   Radiology No results found.  Procedures Foreign Body Removal  Date/Time: 08/14/2023 8:08  PM  Performed by: Hardy Lia, NP Authorized by: Hardy Lia, NP   Consent:    Consent obtained:  Verbal   Consent given by:  Patient   Risks discussed:  Bleeding and incomplete removal   Alternatives discussed:  No treatment Universal protocol:    Procedure explained and questions answered to patient or proxy's satisfaction: yes     Patient identity confirmed:  Verbally with patient Location:    Location: Right upper thigh/groin. Anesthesia:    Anesthesia method:  None Procedure details:    Localization method:  Visualized   Removal mechanism: Tweezers. Post-procedure details:    Confirmation:  No additional foreign bodies on visualization Comments:     Able to remove remainder of tick from the patient's right thigh.  Patient tolerated the procedure well.  (including critical care time)  Medications Ordered in UC Medications - No data to display  Initial Impression / Assessment and Plan / UC Course  I have reviewed the triage vital signs and the nursing notes.  Pertinent labs & imaging results that were available during my care of the patient were reviewed by me and  considered in my medical decision making (see chart for details).  Able to remove the remainder of the tick without difficulty.  Supportive care recommendations were provided and discussed with the patient to include monitoring the area for a bull's-eye rash, or follow-up immediately if he develops fever, chills, increased fatigue, joint pain, joint swelling or other concerns.  Patient advised he can take over-the-counter antihistamines as needed for itching.  Patient was in agreement with this plan of care and verbalizes understanding.  All questions were answered.  Patient stable for discharge.   Final Clinical Impressions(s) / UC Diagnoses   Final diagnoses:  Tick bite with subsequent removal of tick   Discharge Instructions   None    ED Prescriptions   None    PDMP not reviewed this encounter.   Hardy Lia, NP 08/14/23 2010

## 2023-08-14 NOTE — ED Triage Notes (Signed)
 States he has the head of a tick stuck in the back of right leg since Friday.  Aaron Aas

## 2024-03-28 ENCOUNTER — Emergency Department (HOSPITAL_COMMUNITY)
Admission: EM | Admit: 2024-03-28 | Discharge: 2024-03-28 | Disposition: A | Discharge status: Home / Self Care | Payer: Self-pay | Attending: Emergency Medicine | Admitting: Emergency Medicine

## 2024-03-28 ENCOUNTER — Telehealth (HOSPITAL_COMMUNITY): Payer: Self-pay | Admitting: *Deleted

## 2024-03-28 ENCOUNTER — Other Ambulatory Visit: Payer: Self-pay

## 2024-03-28 ENCOUNTER — Encounter (HOSPITAL_COMMUNITY): Payer: Self-pay

## 2024-03-28 DIAGNOSIS — F419 Anxiety disorder, unspecified: Secondary | ICD-10-CM | POA: Insufficient documentation

## 2024-03-28 DIAGNOSIS — R002 Palpitations: Secondary | ICD-10-CM | POA: Diagnosis present

## 2024-03-28 DIAGNOSIS — I4891 Unspecified atrial fibrillation: Secondary | ICD-10-CM

## 2024-03-28 LAB — CBC WITH DIFFERENTIAL/PLATELET
Abs Immature Granulocytes: 0.03 K/uL (ref 0.00–0.07)
Basophils Absolute: 0 K/uL (ref 0.0–0.1)
Basophils Relative: 0 %
Eosinophils Absolute: 0.4 K/uL (ref 0.0–0.5)
Eosinophils Relative: 4 %
HCT: 45.7 % (ref 39.0–52.0)
Hemoglobin: 15.3 g/dL (ref 13.0–17.0)
Immature Granulocytes: 0 %
Lymphocytes Relative: 31 %
Lymphs Abs: 3.1 K/uL (ref 0.7–4.0)
MCH: 27.9 pg (ref 26.0–34.0)
MCHC: 33.5 g/dL (ref 30.0–36.0)
MCV: 83.4 fL (ref 80.0–100.0)
Monocytes Absolute: 0.9 K/uL (ref 0.1–1.0)
Monocytes Relative: 9 %
Neutro Abs: 5.6 K/uL (ref 1.7–7.7)
Neutrophils Relative %: 56 %
Platelets: 368 K/uL (ref 150–400)
RBC: 5.48 MIL/uL (ref 4.22–5.81)
RDW: 13.2 % (ref 11.5–15.5)
WBC: 10.1 K/uL (ref 4.0–10.5)
nRBC: 0 % (ref 0.0–0.2)

## 2024-03-28 LAB — BASIC METABOLIC PANEL WITH GFR
Anion gap: 10 (ref 5–15)
BUN: 11 mg/dL (ref 6–20)
CO2: 28 mmol/L (ref 22–32)
Calcium: 9.4 mg/dL (ref 8.9–10.3)
Chloride: 102 mmol/L (ref 98–111)
Creatinine, Ser: 0.86 mg/dL (ref 0.61–1.24)
GFR, Estimated: >60 mL/min
Glucose, Bld: 119 mg/dL — ABNORMAL HIGH (ref 70–99)
Potassium: 3.7 mmol/L (ref 3.5–5.1)
Sodium: 140 mmol/L (ref 135–145)

## 2024-03-28 LAB — TSH: TSH: 1.76 u[IU]/mL (ref 0.350–4.500)

## 2024-03-28 MED ORDER — DILTIAZEM LOAD VIA INFUSION
10.0000 mg | Freq: Once | INTRAVENOUS | Status: AC
  Administered 2024-03-28: 10 mg via INTRAVENOUS
  Filled 2024-03-28: qty 10

## 2024-03-28 MED ORDER — METOPROLOL TARTRATE 25 MG PO TABS
25.0000 mg | ORAL_TABLET | Freq: Once | ORAL | Status: AC
  Administered 2024-03-28: 25 mg via ORAL
  Filled 2024-03-28: qty 1

## 2024-03-28 MED ORDER — METOPROLOL SUCCINATE ER 25 MG PO TB24: 12.5000 mg | ORAL_TABLET | Freq: Two times a day (BID) | ORAL | 0 refills | Status: DC

## 2024-03-28 MED ORDER — METOPROLOL TARTRATE 25 MG PO TABS: 25.0000 mg | ORAL_TABLET | Freq: Two times a day (BID) | ORAL | 3 refills | Status: DC

## 2024-03-28 MED ORDER — METOPROLOL SUCCINATE ER 25 MG PO TB24: 25.0000 mg | ORAL_TABLET | Freq: Two times a day (BID) | ORAL | Status: DC

## 2024-03-28 MED ORDER — DILTIAZEM HCL-DEXTROSE 125-5 MG/125ML-% IV SOLN (PREMIX)
5.0000 mg/h | INTRAVENOUS | Status: DC
  Administered 2024-03-28: 5 mg/h via INTRAVENOUS
  Filled 2024-03-28: qty 125

## 2024-03-28 NOTE — Discharge Instructions (Addendum)
 Follow-up in the cardiology clinic in the next few days.  The contact information for Kenneth Walls has been provided in this discharge summary for you to call and make these arrangements.  He is the provider you saw in January after your previous episode.  Begin taking metoprolol  as prescribed.  If you feel as though your heart is beating faster, you can take an extra 12.5 mg dose daily as needed.  Return to the ER if you experience any new and/or concerning issues.

## 2024-03-28 NOTE — ED Triage Notes (Signed)
 Pt BIB ems from home. Pt states  My heart was fluttering, I can feel the palpitations Hx of A-Fib. Denies chest pain. HR in 170's with EMS.

## 2024-03-28 NOTE — ED Notes (Signed)
 ED Provider at bedside.

## 2024-03-28 NOTE — ED Provider Notes (Signed)
 " Kenneth Walls EMERGENCY DEPARTMENT AT [facility] Provider Note   CSN: [id number] Arrival date & time: 03/28/24  9942     Patient presents with: Palpitations (Pt BIB ems from home. Pt states  My heart was fluttering, I can feel the palpitations Hx of A-Fib. Denies chest pain. HR in 170's with EMS. )   Kenneth Walls is a 23 y.o. male.   Patient is a 23 year old male presenting with complaints of palpitations.  He was diagnosed in January 2025 with atrial fibrillation.  He was seen by cardiology and was treated with Lopressor .  This medication has been since discontinued.  He has had no further episodes until this evening.  He states he was laying down to go to sleep when his heart began racing.  He denies any chest pain.  No fevers or chills.  He does report feeling somewhat anxious.  He does report caffeine intake, but no recent alcohol intake.       Prior to Admission medications  Not on File    Allergies: Patient has no known allergies.    Review of Systems  All other systems reviewed and are negative.   Updated Vital Signs BP (!) 144/90 (BP Location: Left Arm)   Pulse (!) 140   Temp 98.1 F (36.7 C)   Resp (!) 22   Ht 5' 11 (1.803 m)   Wt 59 kg   SpO2 98%   BMI 18.13 kg/m   Physical Exam Vitals and nursing note reviewed.  Constitutional:      General: He is not in acute distress.    Appearance: He is well-developed. He is not diaphoretic.  HENT:     Head: Normocephalic and atraumatic.  Cardiovascular:     Rate and Rhythm: Tachycardia present. Rhythm irregular.     Heart sounds: No murmur heard.    No friction rub.  Pulmonary:     Effort: Pulmonary effort is normal. No respiratory distress.     Breath sounds: Normal breath sounds. No wheezing or rales.  Abdominal:     General: Bowel sounds are normal. There is no distension.     Palpations: Abdomen is soft.     Tenderness: There is no abdominal tenderness.  Musculoskeletal:         General: Normal range of motion.     Cervical back: Normal range of motion and neck supple.  Skin:    General: Skin is warm and dry.  Neurological:     Mental Status: He is alert and oriented to person, place, and time.     Coordination: Coordination normal.     (all labs ordered are listed, but only abnormal results are displayed) Labs Reviewed  BASIC METABOLIC PANEL WITH GFR  CBC WITH DIFFERENTIAL/PLATELET  TSH    EKG: EKG Interpretation Date/Time:  Friday March 28 2024 00:58:32 EST Ventricular Rate:  176 PR Interval:    QRS Duration:  84 QT Interval:  267 QTC Calculation: 466 R Axis:   71  Text Interpretation: Atrial fibrillation RSR' in V1 or V2, probably normal variant ST depr, consider ischemia, inferior leads Confirmed by Geroldine Berg ([id number]) on 03/28/2024 1:01:08 AM  Radiology: No results found.   Procedures   Medications Ordered in the ED  diltiazem  (CARDIZEM ) 1 mg/mL load via infusion 10 mg (has no administration in time range)    And  diltiazem  (CARDIZEM ) 125 mg in dextrose  5% 125 mL (1 mg/mL) infusion (has no administration in time range)  Medical Decision Making Amount and/or Complexity of Data Reviewed Labs: ordered.  Risk Prescription drug management.   Patient is a 23 year old male presenting with his second episode of atrial fibrillation.  The first was in January 2025.  Symptoms began just prior to arrival.  Patient arrives here with an irregularly irregular heartbeat with rate in the 150-170 range.  Vital signs otherwise stable and patient is mentating appropriately.  Laboratory studies obtained including CBC, metabolic panel, and TSH, all of which are unremarkable.  Patient started on a Cardizem  drip with good rate control.  He was observed for several hours, but persisted with atrial fibrillation and did not convert back to sinus rhythm.  He had previously been on metoprolol  12.5 mg twice daily, but  this was stopped many months ago.  He was given 25 mg here and his heart rate is well-controlled.  I feel as though he can safely be discharged.  I will prescribe metoprolol  and have him follow-up with cardiology.  Patient has a chads score of 0 and no anticoagulation is indicated.  CRITICAL CARE Performed by: Vicenta Able Total critical care time: 40 minutes Critical care time was exclusive of separately billable procedures and treating other patients. Critical care was necessary to treat or prevent imminent or life-threatening deterioration. Critical care was time spent personally by me on the following activities: development of treatment plan with patient and/or surrogate as well as nursing, discussions with consultants, evaluation of patient's response to treatment, examination of patient, obtaining history from patient or surrogate, ordering and performing treatments and interventions, ordering and review of laboratory studies, ordering and review of radiographic studies, pulse oximetry and re-evaluation of patient's condition.      Final diagnoses:  None    ED Discharge Orders     None          Able Vicenta, MD 03/28/24 [phone]  "

## 2024-03-28 NOTE — Telephone Encounter (Signed)
 Per Thom Heinrich PA if heart rates are still elevated after ER visit recommend increasing metoprolol  succinate to 25mg  twice a day and schedule follow up.  Unable to reach patient by phone - number is not in service; patient message sent for recommendations and appt.

## 2024-05-14 ENCOUNTER — Encounter (HOSPITAL_COMMUNITY): Payer: Self-pay | Admitting: Internal Medicine

## 2024-05-14 ENCOUNTER — Ambulatory Visit (HOSPITAL_COMMUNITY)
Admission: RE | Admit: 2024-05-14 | Discharge: 2024-05-14 | Disposition: A | Source: Ambulatory Visit | Attending: Internal Medicine | Admitting: Internal Medicine

## 2024-05-14 VITALS — BP 130/90 | HR 78 | Ht 71.0 in | Wt 127.0 lb

## 2024-05-14 DIAGNOSIS — I480 Paroxysmal atrial fibrillation: Secondary | ICD-10-CM | POA: Diagnosis not present

## 2024-05-14 MED ORDER — METOPROLOL SUCCINATE ER 25 MG PO TB24: 12.5000 mg | ORAL_TABLET | Freq: Every day | ORAL | 2 refills | Status: AC

## 2024-05-14 NOTE — Patient Instructions (Signed)
 metoprolol  succinate (TOPROL -XL) 12.5 mg at bedtime   Echocardiogram -- scheduling will call once insurance authorization received.

## 2024-05-14 NOTE — Progress Notes (Signed)
 "   Primary Care Physician: Marvine Rush, MD Primary Cardiologist: None Electrophysiologist: None     Referring Physician: ED     Kenneth Walls is a 24 y.o. male with a history of ADHD who presents for consultation in the [facility]. The patient was initially diagnosed with atrial fibrillation on 04/20/23 after presenting to ED with symptoms of palpitations found to be in new onset Afib with RVR (HR 104). Discharged on Lopressor  12.5 mg BID. Patient has a CHADS2VASC score of zero.  On evaluation today, he is currently in NSR. He notes the day prior to ED visit had acute stress with argument with fiance and drank 5 beers. He normally drinks occasional alcohol weekly. He does admit to drinking 6 Dr. Nunzio beverages every day. Denies snoring or stop breathing. He is about to start Lexapro for anxiety.   Follow up 05/14/24. Patient is currently in NSR. He was seen in the ED on 03/28/24 for Afib with RVR.  Patient believes his A-fib came back because he was still smoking.  He has dramatically cut back on his caffeine intake, decreasing from 9 caffeinated sodas to about 2 or 3 a day.  He is still working on trying to lower his caffeine consumption.  He stopped smoking the day of the ED visit and has not smoked since.  He decrease his Toprol  to 1/2 tablet every evening and has not had any palpitations since the ER visit.  He is not on anticoagulation due to a risk score of zero.  Today, he denies symptoms of chest pain, shortness of breath, orthopnea, PND, lower extremity edema, dizziness, presyncope, syncope, snoring, daytime somnolence, bleeding, or neurologic sequela. The patient is tolerating medications without difficulties and is otherwise without complaint today.    Atrial Fibrillation Risk Factors:  he does not have symptoms or diagnosis of sleep apnea.   he has a BMI of Body mass index is 17.71 kg/m.SABRA Filed Weights   05/14/24 1529  Weight: 57.6 kg      Current Outpatient Medications  Medication Sig Dispense Refill   metoprolol  succinate (TOPROL -XL) 25 MG 24 hr tablet Take 0.5 tablets (12.5 mg total) by mouth at bedtime. 45 tablet 2   No current facility-administered medications for this encounter.    Atrial Fibrillation Management history:  Previous antiarrhythmic drugs: none Previous cardioversions: none Previous ablations: none Anticoagulation history: none   ROS- All systems are reviewed and negative except as per the HPI above.  Physical Exam: BP (!) 130/90   Pulse 78   Ht 5' 11 (1.803 m)   Wt 57.6 kg   BMI 17.71 kg/m   GEN- The patient is well appearing, alert and oriented x 3 today.   Neck - no JVD or carotid bruit noted Lungs- Clear to ausculation bilaterally, normal work of breathing Heart- Regular rate and rhythm, no murmurs, rubs or gallops, PMI not laterally displaced Extremities- no clubbing, cyanosis, or edema Skin - no rash or ecchymosis noted   EKG today demonstrates  EKG Interpretation Date/Time:  Wednesday May 14 2024 15:32:16 EST Ventricular Rate:  78 PR Interval:  130 QRS Duration:  88 QT Interval:  370 QTC Calculation: 421 R Axis:   94  Text Interpretation: Normal sinus rhythm Rightward axis Borderline ECG When compared with ECG of 28-Mar-2024 00:58, PREVIOUS ECG IS PRESENT Confirmed by Terra Pac ([id number]) on 05/14/2024 3:49:11 PM    Echo N/A  CHA2DS2-VASc Score = 0  The patient's score is based upon: CHF  History: 0 HTN History: 0 Diabetes History: 0 Stroke History: 0 Vascular Disease History: 0 Age Score: 0 Gender Score: 0       ASSESSMENT AND PLAN: Paroxysmal Atrial Fibrillation (ICD10:  I48.0) The patient's CHA2DS2-VASc score is 0, indicating a 0.2% annual risk of stroke.    Patient is currently in NSR.  We discussed caffeine as a likely trigger for A-fib.  Patient has made great improvement in this category and is trying to further decrease his caffeine  consumption.  I will order a baseline echocardiogram as it was not completed previously.  Continue Toprol  12.5 mg nightly.  If patient has further decreased or eliminated his caffeine consumption at upcoming appointment, can consider discontinuation of Toprol  if no further palpitations.    Follow up 6 months A-fib clinic.    Terra Pac, PA-C  [facility] [address] [place], [place] [address] [phone]  "

## 2024-05-16 ENCOUNTER — Other Ambulatory Visit (HOSPITAL_COMMUNITY): Payer: Self-pay | Admitting: *Deleted

## 2024-05-16 DIAGNOSIS — I480 Paroxysmal atrial fibrillation: Secondary | ICD-10-CM

## 2024-11-11 ENCOUNTER — Ambulatory Visit (HOSPITAL_COMMUNITY): Admitting: Internal Medicine
